# Patient Record
Sex: Male | Born: 1975 | Race: White | Hispanic: No | Marital: Married | Smoking: Current some day smoker
Health system: Southern US, Community
[De-identification: ages and names within clinical notes are randomized; demographics above are authoritative.]

## PROBLEM LIST (undated history)

## (undated) DIAGNOSIS — I4891 Unspecified atrial fibrillation: Secondary | ICD-10-CM

## (undated) HISTORY — PX: APPENDECTOMY: SHX54

---

## 2008-03-24 ENCOUNTER — Observation Stay: Payer: Self-pay | Admitting: Vascular Surgery

## 2008-03-24 ENCOUNTER — Other Ambulatory Visit: Payer: Self-pay

## 2008-05-08 ENCOUNTER — Emergency Department: Payer: Self-pay | Admitting: Emergency Medicine
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
## 2019-01-05 ENCOUNTER — Ambulatory Visit
Admission: RE | Admit: 2019-01-05 | Discharge: 2019-01-05 | Disposition: A | Discharge status: Home / Self Care | Payer: BLUE CROSS/BLUE SHIELD | Attending: Cardiology | Admitting: Cardiology

## 2019-01-05 ENCOUNTER — Ambulatory Visit: Payer: BLUE CROSS/BLUE SHIELD | Admitting: Anesthesiology

## 2019-01-05 ENCOUNTER — Other Ambulatory Visit: Payer: Self-pay

## 2019-01-05 ENCOUNTER — Encounter
Admission: RE | Disposition: A | Discharge status: Home / Self Care | Payer: Self-pay | Source: Home / Self Care | Attending: Cardiology

## 2019-01-05 DIAGNOSIS — F17210 Nicotine dependence, cigarettes, uncomplicated: Secondary | ICD-10-CM | POA: Insufficient documentation

## 2019-01-05 DIAGNOSIS — I4819 Other persistent atrial fibrillation: Secondary | ICD-10-CM | POA: Insufficient documentation

## 2019-01-05 DIAGNOSIS — Z7901 Long term (current) use of anticoagulants: Secondary | ICD-10-CM | POA: Insufficient documentation

## 2019-01-05 DIAGNOSIS — J449 Chronic obstructive pulmonary disease, unspecified: Secondary | ICD-10-CM | POA: Diagnosis not present

## 2019-01-05 DIAGNOSIS — I480 Paroxysmal atrial fibrillation: Secondary | ICD-10-CM | POA: Insufficient documentation

## 2019-01-05 HISTORY — PX: CARDIOVERSION: EP1203

## 2019-01-05 HISTORY — DX: Unspecified atrial fibrillation: I48.91

## 2019-01-05 SURGERY — CARDIOVERSION (CATH LAB)
Anesthesia: General

## 2019-01-05 MED ORDER — PROPOFOL 10 MG/ML IV BOLUS
INTRAVENOUS | Status: DC | PRN
  Administered 2019-01-05: 100 mg via INTRAVENOUS

## 2019-01-05 NOTE — Anesthesia Preprocedure Evaluation (Signed)
Anesthesia Evaluation  Patient identified by MRN, date of birth, ID band Patient awake    Reviewed: Allergy & Precautions, NPO status , Patient's Chart, lab work & pertinent test results  History of Anesthesia Complications Negative for: history of anesthetic complications  Airway Mallampati: II  TM Distance: >3 FB Neck ROM: Full    Dental  (+) Partial Upper   Pulmonary neg sleep apnea, neg COPD, Current Smoker,    breath sounds clear to auscultation- rhonchi (-) wheezing      Cardiovascular Exercise Tolerance: Good (-) hypertension(-) CAD, (-) Past MI, (-) Cardiac Stents and (-) CABG + dysrhythmias Atrial Fibrillation  Rhythm:Regular Rate:Normal - Systolic murmurs and - Diastolic murmurs    Neuro/Psych neg Seizures negative neurological ROS  negative psych ROS   GI/Hepatic negative GI ROS, Neg liver ROS,   Endo/Other  negative endocrine ROSneg diabetes  Renal/GU negative Renal ROS     Musculoskeletal negative musculoskeletal ROS (+)   Abdominal (+) - obese,   Peds  Hematology negative hematology ROS (+)   Anesthesia Other Findings Past Medical History: No date: Atrial fibrillation (HCC)   Reproductive/Obstetrics                             Anesthesia Physical Anesthesia Plan  ASA: II  Anesthesia Plan: General   Post-op Pain Management:    Induction: Intravenous  PONV Risk Score and Plan: 0 and Propofol infusion  Airway Management Planned: Natural Airway  Additional Equipment:   Intra-op Plan:   Post-operative Plan:   Informed Consent: I have reviewed the patients History and Physical, chart, labs and discussed the procedure including the risks, benefits and alternatives for the proposed anesthesia with the patient or authorized representative who has indicated his/her understanding and acceptance.     Dental advisory given  Plan Discussed with: CRNA and  Anesthesiologist  Anesthesia Plan Comments:         Anesthesia Quick Evaluation

## 2019-01-05 NOTE — Discharge Instructions (Signed)
Electrical Cardioversion, Care After °This sheet gives you information about how to care for yourself after your procedure. Your health care provider may also give you more specific instructions. If you have problems or questions, contact your health care provider. °What can I expect after the procedure? °After the procedure, it is common to have: °· Some redness on the skin where the shocks were given. °Follow these instructions at home: ° °· Do not drive for 24 hours if you were given a medicine to help you relax (sedative). °· Take over-the-counter and prescription medicines only as told by your health care provider. °· Ask your health care provider how to check your pulse. Check it often. °· Rest for 48 hours after the procedure or as told by your health care provider. °· Avoid or limit your caffeine use as told by your health care provider. °Contact a health care provider if: °· You feel like your heart is beating too quickly or your pulse is not regular. °· You have a serious muscle cramp that does not go away. °Get help right away if: ° °· You have discomfort in your chest. °· You are dizzy or you feel faint. °· You have trouble breathing or you are short of breath. °· Your speech is slurred. °· You have trouble moving an arm or leg on one side of your body. °· Your fingers or toes turn cold or blue. °This information is not intended to replace advice given to you by your health care provider. Make sure you discuss any questions you have with your health care provider. °Document Released: 07/29/2013 Document Revised: 05/11/2016 Document Reviewed: 04/13/2016 °Elsevier Interactive Patient Education © 2019 [employer] °Moderate Conscious Sedation, Adult, Care After °These instructions provide you with information about caring for yourself after your procedure. Your health care provider may also give you more specific instructions. Your treatment has been planned according to current medical practices, but  problems sometimes occur. Call your health care provider if you have any problems or questions after your procedure. °What can I expect after the procedure? °After your procedure, it is common: °· To feel sleepy for several hours. °· To feel clumsy and have poor balance for several hours. °· To have poor judgment for several hours. °· To vomit if you eat too soon. °Follow these instructions at home: °For at least 24 hours after the procedure: ° °· Do not: °? Participate in activities where you could fall or become injured. °? Drive. °? Use heavy machinery. °? Drink alcohol. °? Take sleeping pills or medicines that cause drowsiness. °? Make important decisions or sign legal documents. °? Take care of children on your own. °· Rest. °Eating and drinking °· Follow the diet recommended by your health care provider. °· If you vomit: °? Drink water, juice, or soup when you can drink without vomiting. °? Make sure you have little or no nausea before eating solid foods. °General instructions °· Have a responsible adult stay with you until you are awake and alert. °· Take over-the-counter and prescription medicines only as told by your health care provider. °· If you smoke, do not smoke without supervision. °· Keep all follow-up visits as told by your health care provider. This is important. °Contact a health care provider if: °· You keep feeling nauseous or you keep vomiting. °· You feel light-headed. °· You develop a rash. °· You have a fever. °Get help right away if: °· You have trouble breathing. °This information is not intended   to replace advice given to you by your health care provider. Make sure you discuss any questions you have with your health care provider. °Document Released: 07/29/2013 Document Revised: 03/12/2016 Document Reviewed: 01/28/2016 °Elsevier Interactive Patient Education © 2019 [employer] ° °

## 2019-01-05 NOTE — Op Note (Signed)
[facility] Cardiology   01/05/2019                     8:05 AM  PATIENT:  Louis Villarreal    PRE-OPERATIVE DIAGNOSIS:  Cardioversion  Afib  POST-OPERATIVE DIAGNOSIS:  Same  PROCEDURE:  CARDIOVERSION (CATH LAB)  SURGEON:  Marcina Millard, MD    ANESTHESIA:     PREOPERATIVE INDICATIONS:  Louis Villarreal is a  43 y.o. male with a diagnosis of Cardioversion  Afib who failed conservative measures and elected for surgical management.    The risks benefits and alternatives were discussed with the patient preoperatively including but not limited to the risks of infection, bleeding, cardiopulmonary complications, the need for revision surgery, among others, and the patient was willing to proceed.   OPERATIVE PROCEDURE: The patient was brought to the special procedures holding area in a fasting state.  Anesthesia was obtained with 100 mg of propofol.  The patient underwent electricle cardioversion with 75 and 120 J with successful conversion to normal sinus rhythm.  There were no periprocedural complications.

## 2019-01-05 NOTE — Anesthesia Postprocedure Evaluation (Signed)
Anesthesia Post Note  Patient: Louis Villarreal  Procedure(s) Performed: CARDIOVERSION (CATH LAB) (N/A )  Patient location during evaluation: Other (specials recovery) Anesthesia Type: General Level of consciousness: awake and alert and oriented Pain management: pain level controlled Vital Signs Assessment: post-procedure vital signs reviewed and stable Respiratory status: spontaneous breathing, nonlabored ventilation and respiratory function stable Cardiovascular status: blood pressure returned to baseline and stable Postop Assessment: no signs of nausea or vomiting Anesthetic complications: no     Last Vitals:  Vitals:   01/05/19 0815 01/05/19 0831  BP: 106/75 103/72  Pulse: 64 68  Resp: 16 15  Temp:    SpO2: 94% 97%    Last Pain:  Vitals:   01/05/19 0831  TempSrc:   PainSc: 0-No pain                 Jayshun Galentine

## 2019-01-05 NOTE — Transfer of Care (Signed)
Immediate Anesthesia Transfer of Care Note  Patient: Louis Villarreal  Procedure(s) Performed: CARDIOVERSION (CATH LAB) (N/A )  Patient Location: spu  Anesthesia Type:General  Level of Consciousness: awake  Airway & Oxygen Therapy: Patient Spontanous Breathing and Patient connected to nasal cannula oxygen  Post-op Assessment: Report given to RN and Post -op Vital signs reviewed and stable  Post vital signs: Reviewed  Last Vitals:  Vitals Value Taken Time  BP 101/67 01/05/2019  8:03 AM  Temp 36.5 C 01/05/2019  7:50 AM  Pulse 50 01/05/2019  8:03 AM  Resp 15 01/05/2019  8:03 AM  SpO2 97 % 01/05/2019  8:03 AM  Vitals shown include unvalidated device data.  Last Pain:  Vitals:   01/05/19 0750  TempSrc: Oral  PainSc: 0-No pain         Complications: No apparent anesthesia complications

## 2019-01-05 NOTE — Anesthesia Post-op Follow-up Note (Signed)
Anesthesia QCDR form completed.        

## 2021-09-19 ENCOUNTER — Encounter: Payer: Self-pay | Admitting: Emergency Medicine

## 2021-09-19 ENCOUNTER — Other Ambulatory Visit: Payer: Self-pay

## 2021-09-19 ENCOUNTER — Emergency Department
Admission: EM | Admit: 2021-09-19 | Discharge: 2021-09-19 | Disposition: A | Discharge status: Home / Self Care | Payer: BLUE CROSS/BLUE SHIELD | Attending: Emergency Medicine | Admitting: Emergency Medicine

## 2021-09-19 DIAGNOSIS — F17200 Nicotine dependence, unspecified, uncomplicated: Secondary | ICD-10-CM | POA: Insufficient documentation

## 2021-09-19 DIAGNOSIS — K0889 Other specified disorders of teeth and supporting structures: Secondary | ICD-10-CM | POA: Insufficient documentation

## 2021-09-19 MED ORDER — ONDANSETRON 4 MG PO TBDP: 4.0000 mg | ORAL_TABLET | Freq: Once | ORAL | Status: AC

## 2021-09-19 MED ORDER — AMOXICILLIN 875 MG PO TABS: 875.0000 mg | ORAL_TABLET | Freq: Two times a day (BID) | ORAL | 0 refills | Status: AC

## 2021-09-19 MED ORDER — KETOROLAC TROMETHAMINE 30 MG/ML IJ SOLN
INTRAMUSCULAR | Status: AC
  Filled 2021-09-19: qty 1

## 2021-09-19 MED ORDER — HYDROCODONE-ACETAMINOPHEN 5-325 MG PO TABS: 1.0000 | ORAL_TABLET | Freq: Once | ORAL | Status: AC

## 2021-09-19 MED ORDER — HYDROCODONE-ACETAMINOPHEN 5-325 MG PO TABS
ORAL_TABLET | ORAL | Status: AC
  Administered 2021-09-19: 1 via ORAL
  Filled 2021-09-19: qty 1

## 2021-09-19 MED ORDER — ONDANSETRON 4 MG PO TBDP
ORAL_TABLET | ORAL | Status: AC
  Administered 2021-09-19: 4 mg via ORAL
  Filled 2021-09-19: qty 1

## 2021-09-19 MED ORDER — KETOROLAC TROMETHAMINE 30 MG/ML IJ SOLN: 30.0000 mg | Freq: Once | INTRAMUSCULAR | Status: DC

## 2021-09-19 NOTE — ED Provider Notes (Signed)
[facility]-EMERGENCY DEPARTMENT  ____________________________________________  Time seen: Approximately 10:05 PM  I have reviewed the triage vital signs and the nursing notes.   HISTORY  Chief Complaint Dental Pain   Historian Patient     HPI Louis Villarreal is a 45 y.o. male presents to the emergency department with right-sided dental pain after biting into a PICC line.  Patient has an appointment with a local dentist on Thursday but states that he would like to get started on antibiotic until he can be seen.  No pain underneath the tongue or difficulty swallowing.   Past Medical History:  Diagnosis Date   Atrial fibrillation (HCC)      Immunizations up to date:  Yes.     Past Medical History:  Diagnosis Date   Atrial fibrillation (HCC)     There are no problems to display for this patient.   Past Surgical History:  Procedure Laterality Date   APPENDECTOMY     45 years old   CARDIOVERSION N/A 01/05/2019   Procedure: CARDIOVERSION (CATH LAB);  Surgeon: Marcina Millard, MD;  Location: [facility] ORS;  Service: Cardiovascular;  Laterality: N/A;    Prior to Admission medications   Medication Sig Start Date End Date Taking? Authorizing Provider  amoxicillin (AMOXIL) 875 MG tablet Take 1 tablet (875 mg total) by mouth 2 (two) times daily for 10 days. 09/19/21 09/29/21 Yes Pia Mau M, PA-C  apixaban (ELIQUIS) 5 MG TABS tablet Take 5 mg by mouth 2 (two) times daily.    [provider]    Allergies Patient has no known allergies.  History reviewed. No pertinent family history.  Social History Social History   Tobacco Use   Smoking status: Some Days   Smokeless tobacco: Never  Vaping Use   Vaping Use: Never used  Substance Use Topics   Alcohol use: Yes    Comment: rarely   Drug use: Yes    Types: Marijuana     Review of Systems  Constitutional: No fever/chills Eyes:  No discharge ENT: Patient has dental pain.  Respiratory: no cough. No  SOB/ use of accessory muscles to breath Gastrointestinal:   No nausea, no vomiting.  No diarrhea.  No constipation. Musculoskeletal: Negative for musculoskeletal pain. Skin: Negative for rash, abrasions, lacerations, ecchymosis.   ____________________________________________   PHYSICAL EXAM:  VITAL SIGNS: ED Triage Vitals  Enc Vitals Group     BP 09/19/21 1921 103/85     Pulse Rate 09/19/21 1921 (!) 105     Resp 09/19/21 1921 20     Temp --      Temp src --      SpO2 09/19/21 1921 99 %     Weight 09/19/21 1922 200 lb (90.7 kg)     Height 09/19/21 1922 6\' 3"  (1.905 m)     Head Circumference --      Peak Flow --      Pain Score 09/19/21 1937 10     Pain Loc --      Pain Edu? --      Excl. in GC? --      Constitutional: Alert and oriented. Well appearing and in no acute distress. Eyes: Conjunctivae are normal. PERRL. EOMI. Head: Atraumatic. ENT:      Ears:       Nose: No congestion/rhinnorhea.      Mouth/Throat: Mucous membranes are moist.  Patient has broken superior 3 Neck: No stridor.  No cervical spine tenderness to palpation. Cardiovascular: Normal rate, regular rhythm. Normal  S1 and S2.  Good peripheral circulation. Respiratory: Normal respiratory effort without tachypnea or retractions. Lungs CTAB. Good air entry to the bases with no decreased or absent breath sounds Gastrointestinal: Bowel sounds x 4 quadrants. Soft and nontender to palpation. No guarding or rigidity. No distention.* Musculoskeletal: Full range of motion to all extremities. No obvious deformities noted Neurologic:  Normal for age. No gross focal neurologic deficits are appreciated.  Skin:  Skin is warm, dry and intact. No rash noted. Psychiatric: Mood and affect are normal for age. Speech and behavior are normal.   ____________________________________________   LABS (all labs ordered are listed, but only abnormal results are displayed)  Labs Reviewed - No data to  display ____________________________________________  EKG   ____________________________________________  RADIOLOGY   No results found.  ____________________________________________    PROCEDURES  Procedure(s) performed:     Procedures     Medications  ketorolac (TORADOL) 30 MG/ML injection 30 mg (has no administration in time range)  HYDROcodone-acetaminophen (NORCO/VICODIN) 5-325 MG per tablet 1 tablet (has no administration in time range)  ondansetron (ZOFRAN-ODT) disintegrating tablet 4 mg (has no administration in time range)     ____________________________________________   INITIAL IMPRESSION / ASSESSMENT AND PLAN / ED COURSE  Pertinent labs & imaging results that were available during my care of the patient were reviewed by me and considered in my medical decision making (see chart for details).      Assessment and plan Dental pain 45 year old male presents to the emergency department with right-sided upper dental pain.  Patient was started on amoxicillin and given an injection of Toradol in the emergency department as well as a single dose of Norco.  He was advised to keep appointment with local dentist.     ____________________________________________  FINAL CLINICAL IMPRESSION(S) / ED DIAGNOSES  Final diagnoses:  Pain, dental      NEW MEDICATIONS STARTED DURING THIS VISIT:  ED Discharge Orders          Ordered    amoxicillin (AMOXIL) 875 MG tablet  2 times daily        09/19/21 2201                This chart was dictated using voice recognition software/Dragon. Despite best efforts to proofread, errors can occur which can change the meaning. Any change was purely unintentional.     Gasper Lloyd 09/19/21 2207    Delton Prairie, MD 09/22/21 [phone]

## 2021-09-19 NOTE — ED Notes (Signed)
Patient is agitated, swearing, throwing his water bottle. Patient willingly called his wife after being informed that he can't drive home after taking Norco. Toradol was held by J. Woods PA-C due to the patient being on Eliquis and had already taken "1000mg  Naproxen" earlier today. Patient states his wife is on the way and wanted to go outside to wait. Patient was informed that we would walk him out when his wife is physically here. Patient became more agitated and stated that he can only get in touch with her by Messenger. Security has been called for assistance.

## 2021-09-19 NOTE — ED Notes (Signed)
Patient states his wife is outside. Patient was escorted out with security.

## 2021-09-19 NOTE — ED Triage Notes (Signed)
Pt arrived via POV with c/o R side dental pain from biting into a pecan over the weekend. Pt states he was eating tonight and irritated the spot, states he has appt with a dentist on Thursday. Pt c/o throbbing pain to R side.

## 2021-09-19 NOTE — Discharge Instructions (Signed)
OPTIONS FOR DENTAL FOLLOW UP CARE ° °[place] [facility] and [facility] - Local Safety Net Dental Clinics °http://www.ncdhhs.gov/dph/oralhealth/services/safetynetclinics.htm °  °[facility] (336-562-3123) ° °[facility] ([phone]) ° °[facility] (919-663-1744 [phone]) ° °[facility] ([phone]) ° °[facility] ([phone]) °This clinic caters to the indigent population and is on a lottery system. °Location: °[facility], [address], [place] °Clinic Hours: °Wednesdays from 6pm - 9pm, patients seen by a lottery system. °For dates, call or go to [email or web address] °Services: °Cleanings, fillings and simple extractions. °Payment Options: °DENTAL WORK IS FREE OF CHARGE. Bring proof of income or support. °Best way to get seen: °Arrive at 5:15 pm - this is a lottery, NOT first come/first serve, so arriving earlier will not increase your chances of being seen. °  °  °[facility] °[phone] °Select option 1 for emergencies °  °Location: °[facility], [address], [place] °Clinic Hours: °No walk-ins accepted - call the day before to schedule an appointment. °Check in times are 9:30 am and 1:30 pm. °Services: °Simple extractions, temporary fillings, pulpectomy/pulp debridement, uncomplicated abscess drainage. °Payment Options: °PAYMENT IS DUE AT THE TIME OF SERVICE.  Fee is usually $100-200, additional surgical procedures (e.g. abscess drainage) may be extra. °Cash, checks, Visa/MasterCard accepted.  Can file Medicaid if patient is covered for dental - patient should call case worker to check. °No discount for [facility] patients. °Best way to get seen: °MUST call the day before and get onto the schedule. Can usually be seen the next 1-2 days. No walk-ins accepted. °  °  °[facility] °[phone] °   °Location: °[facility], [address], [place] °Clinic Hours: °M, W, Th, F 8am or 1:30pm, Tues 9a or 1:30 - first come/first served. °Services: °Simple extractions, temporary fillings, uncomplicated abscess drainage.  You do not need to be an [place] resident. °Payment Options: °PAYMENT IS DUE AT THE TIME OF SERVICE. °Dental insurance, otherwise sliding scale - bring proof of income or support. °Depending on income and treatment needed, cost is usually $50-200. °Best way to get seen: °Arrive early as it is first come/first served. °  °  °[facility] °[phone] °  °Location: °[address] °Clinic Hours: °Mon-Thu 8a-5p °Services: °Most basic dental services including extractions and fillings. °Payment Options: °PAYMENT IS DUE AT THE TIME OF SERVICE. °Sliding scale, up to 50% off - bring proof if income or support. °Medicaid with dental option accepted. °Best way to get seen: °Call to schedule an appointment, can usually be seen within 2 weeks OR they will try to see walk-ins - show up at 8a or 2p (you may have to wait). °  °  °[facility] °[phone] °[place] RESIDENTS ONLY °  °Location: °[facility], [address], [place], [place] [address] °Clinic Hours: By appointment only. °Monday - Thursday 8am-5pm, Friday 8am-12pm °Services: Cleanings, fillings, extractions. °Payment Options: °PAYMENT IS DUE AT THE TIME OF SERVICE. °Cash, Visa or MasterCard. Sliding scale - $30 minimum per service. °Best way to get seen: °Come in to office, complete packet and make an appointment - need proof of income °or support monies for each household member and proof of [place] residence. °Usually takes about a month to get in. °  °  °[facility] °[phone] °  °Location: °[address]   [place] °Clinic Hours: Walk-in Urgent Care Dental Services are offered Monday-Friday  mornings only. °The numbers of emergencies accepted daily is limited to the number of °providers available. °Maximum 15 - Mondays, Wednesdays & Thursdays °Maximum 10 - Tuesdays & Fridays °Services: °You do not need to be a [place] resident to be seen for a dental emergency. °Emergencies are defined as pain, swelling, abnormal bleeding, or dental trauma. Walkins will receive x-rays if needed. °NOTE: Dental cleaning is not an emergency. °Payment Options: °PAYMENT IS DUE AT THE TIME OF SERVICE. °Minimum co-pay is $40.00 for uninsured patients. °Minimum co-pay is $3.00 for Medicaid with dental coverage. °Dental Insurance is accepted and must be presented at time of visit. °Medicare does not cover dental. °Forms of payment: Cash, credit card, checks. °Best way to get seen: °If not previously registered with the clinic, walk-in dental registration begins at 7:15 am and is on a first come/first serve basis. °If previously registered with the clinic, call to make an appointment. °  °  °[facility] °[phone] °[place] RESIDENTS ONLY °  °Location: °[address], [place], [place] °Clinic Hours: °Mon-Thu 10a-2p °Services: Extractions only! °Payment Options: °FREE (donations accepted) - bring proof of income or support °Best way to get seen: °Call and schedule an appointment OR come at 8am on the 1st Monday of every month (except for holidays) when it is first come/first served. °  °  °[facility] °[phone] °  °Location: °[address], [place] °Clinic Hours: °Friday mornings °Services, Payment Options, Best way to get seen: °Call for info °
